# Patient Record
Sex: Female | Born: 1970 | Hispanic: Yes | Marital: Married | Smoking: Never smoker
Health system: Southern US, Community
[De-identification: ages and names within clinical notes are randomized; demographics above are authoritative.]

## PROBLEM LIST (undated history)

## (undated) DIAGNOSIS — G43909 Migraine, unspecified, not intractable, without status migrainosus: Secondary | ICD-10-CM

---

## 2005-06-27 ENCOUNTER — Emergency Department: Payer: Self-pay | Admitting: Emergency Medicine

## 2018-05-27 ENCOUNTER — Emergency Department
Admission: EM | Admit: 2018-05-27 | Discharge: 2018-05-27 | Disposition: A | Discharge status: Home / Self Care | Payer: BLUE CROSS/BLUE SHIELD | Attending: Student in an Organized Health Care Education/Training Program | Admitting: Student in an Organized Health Care Education/Training Program

## 2018-05-27 ENCOUNTER — Emergency Department: Payer: BLUE CROSS/BLUE SHIELD

## 2018-05-27 ENCOUNTER — Encounter: Payer: Self-pay | Admitting: Emergency Medicine

## 2018-05-27 ENCOUNTER — Other Ambulatory Visit: Payer: Self-pay

## 2018-05-27 DIAGNOSIS — Z711 Person with feared health complaint in whom no diagnosis is made: Secondary | ICD-10-CM | POA: Insufficient documentation

## 2018-05-27 DIAGNOSIS — R51 Headache: Secondary | ICD-10-CM | POA: Diagnosis not present

## 2018-05-27 DIAGNOSIS — K0889 Other specified disorders of teeth and supporting structures: Secondary | ICD-10-CM | POA: Insufficient documentation

## 2018-05-27 DIAGNOSIS — R519 Headache, unspecified: Secondary | ICD-10-CM

## 2018-05-27 HISTORY — DX: Migraine, unspecified, not intractable, without status migrainosus: G43.909

## 2018-05-27 MED ORDER — ACETAMINOPHEN 500 MG PO TABS: 500.0000 mg | ORAL_TABLET | Freq: Four times a day (QID) | ORAL | 0 refills | Status: AC | PRN

## 2018-05-27 MED ORDER — IBUPROFEN 600 MG PO TABS: 600.0000 mg | ORAL_TABLET | Freq: Four times a day (QID) | ORAL | 0 refills | Status: AC | PRN

## 2018-05-27 MED ORDER — KETOROLAC TROMETHAMINE 30 MG/ML IJ SOLN: 30.0000 mg | Freq: Once | INTRAMUSCULAR | Status: DC

## 2018-05-27 MED ORDER — KETOROLAC TROMETHAMINE 30 MG/ML IJ SOLN
30.0000 mg | Freq: Once | INTRAMUSCULAR | Status: AC
  Administered 2018-05-27: 23:00:00 30 mg via INTRAMUSCULAR
  Filled 2018-05-27: qty 1

## 2018-05-27 NOTE — Discharge Instructions (Signed)
OPTIONS FOR DENTAL FOLLOW UP CARE ° °[place] Department of Health and Human Services - Local Safety Net Dental Clinics °http://www.ncdhhs.gov/dph/oralhealth/services/safetynetclinics.htm °  °[facility] (336-562-3123) ° °[facility] [place] (919-933-9087) ° °[facility] ([phone] ext [phone]) ° °[facility] ([phone]) ° °[facility] ([phone]) °This clinic caters to the indigent population and is on a lottery system. °Location: °[facility], [address], [place] °Clinic Hours: °Wednesdays from 6pm - 9pm, patients seen by a lottery system. °For dates, call or go to [email or web address].[facility].edu/[facility]/patients/Dental-[facility] °Services: °Cleanings, fillings and simple extractions. °Payment Options: °DENTAL WORK IS FREE OF CHARGE. Bring proof of income or support. °Best way to get seen: °Arrive at 5:15 pm - this is a lottery, NOT first come/first serve, so arriving earlier will not increase your chances of being seen. °  °  °[facility] °[phone] °Select option 1 for emergencies °  °Location: °[facility], [address], [place] °Clinic Hours: °No walk-ins accepted - call the day before to schedule an appointment. °Check in times are 9:30 am and 1:30 pm. °Services: °Simple extractions, temporary fillings, pulpectomy/pulp debridement, uncomplicated abscess drainage. °Payment Options: °PAYMENT IS DUE AT THE TIME OF SERVICE.  Fee is usually $100-200, additional surgical procedures (e.g. abscess drainage) may be extra. °Cash, checks, Visa/MasterCard accepted.  Can file Medicaid if patient is covered for dental - patient should call case worker to check. °No discount for [facility] patients. °Best way to get seen: °MUST call the day before and get onto the schedule. Can usually be seen the next 1-2 days. No walk-ins accepted. °  °  °[facility] °[phone] °   °Location: °[facility], [address], [place] °Clinic Hours: °M, W, Th, F 8am or 1:30pm, Tues 9a or 1:30 - first come/first served. °Services: °Simple extractions, temporary fillings, uncomplicated abscess drainage.  You do not need to be an [place] resident. °Payment Options: °PAYMENT IS DUE AT THE TIME OF SERVICE. °Dental insurance, otherwise sliding scale - bring proof of income or support. °Depending on income and treatment needed, cost is usually $50-200. °Best way to get seen: °Arrive early as it is first come/first served. °  °  °[facility] °[phone] °  °Location: °[address] °Clinic Hours: °Mon-Thu 8a-5p °Services: °Most basic dental services including extractions and fillings. °Payment Options: °PAYMENT IS DUE AT THE TIME OF SERVICE. °Sliding scale, up to 50% off - bring proof if income or support. °Medicaid with dental option accepted. °Best way to get seen: °Call to schedule an appointment, can usually be seen within 2 weeks OR they will try to see walk-ins - show up at 8a or 2p (you may have to wait). °  °  °[facility] °[phone] °[place] RESIDENTS ONLY °  °Location: °[facility], [address], [place], [place] [address] °Clinic Hours: By appointment only. °Monday - Thursday 8am-5pm, Friday 8am-12pm °Services: Cleanings, fillings, extractions. °Payment Options: °PAYMENT IS DUE AT THE TIME OF SERVICE. °Cash, Visa or MasterCard. Sliding scale - $30 minimum per service. °Best way to get seen: °Come in to office, complete packet and make an appointment - need proof of income °or support monies for each household member and proof of [place] residence. °Usually takes about a month to get in. °  °  °[facility] °[phone] °  °Location: °[address]   [place] °Clinic Hours: Walk-in Urgent Care Dental Services are offered Monday-Friday  mornings only. °The numbers of emergencies accepted daily is limited to the number of °providers available. °Maximum 15 - Mondays, Wednesdays & Thursdays °Maximum 10 - Tuesdays & Fridays °Services: °You do not need to be a [place] resident to be seen for a dental emergency. °Emergencies are defined as pain, swelling, abnormal bleeding, or dental trauma. Walkins will receive x-rays if needed. °NOTE: Dental cleaning is not an emergency. °Payment Options: °PAYMENT IS DUE AT THE TIME OF SERVICE. °Minimum co-pay is $40.00 for uninsured patients. °Minimum co-pay is $3.00 for Medicaid with dental coverage. °Dental Insurance is accepted and must be presented at time of visit. °Medicare does not cover dental. °Forms of payment: Cash, credit card, checks. °Best way to get seen: °If not previously registered with the clinic, walk-in dental registration begins at 7:15 am and is on a first come/first serve basis. °If previously registered with the clinic, call to make an appointment. °  °  °[facility] °[phone] °[place] RESIDENTS ONLY °  °Location: °[address], [place], [place] °Clinic Hours: °Mon-Thu 10a-2p °Services: Extractions only! °Payment Options: °FREE (donations accepted) - bring proof of income or support °Best way to get seen: °Call and schedule an appointment OR come at 8am on the 1st Monday of every month (except for holidays) when it is first come/first served. °  °  °[facility] °[phone] °  °Location: °[address], [place] °Clinic Hours: °Friday mornings °Services, Payment Options, Best way to get seen: °Call for info °

## 2018-05-27 NOTE — ED Notes (Signed)
Pt in for possible overdose of OTC pain medications, See triage note.

## 2018-05-27 NOTE — ED Provider Notes (Signed)
[facility] Emergency Department Provider Note  ____________________________________________  Time seen: Approximately 9:17 PM  I have reviewed the triage vital signs and the nursing notes.   HISTORY  Chief Complaint Headache and Dental Pain    HPI Kirsten Burgess is a 48 y.o. female presents emergency department for evaluation of headache and questions about medication dosage.  Patient came to the emergency department today because she was concerned that she took too much Tylenol and Motrin yesterday.  She took 600 mg of Advil at 10 AM, 500 mg of Tylenol at 4 PM and 500 mg of Tylenol at 4:30 PM.  She did not take any more medications yesterday. She has never had a reaction with tylenol or motrin. She did not take any medication today.  Headache is primarily on the front and has been constant since yesterday.  She rates pain as a 5 out of 10.  She also had a tooth ache yesterday that has resolved today.  She has had a history of migraines in the past and has never seen anybody for them or been prescribed any medication.  No recent illness. She does not feel sick. She has not eaten much today because she has not been hungry.  No cough, shortness of breath, chest pain, nausea vomiting, abdominal pain, dysuria.  Past Medical History:  Diagnosis Date  . Migraine     There are no active problems to display for this patient.   History reviewed. No pertinent surgical history.  Prior to Admission medications   Medication Sig Start Date End Date Taking? Authorizing Provider  acetaminophen (TYLENOL) 500 MG tablet Take 1 tablet (500 mg total) by mouth every 6 (six) hours as needed. 05/27/18   Laban Emperor, PA-C  ibuprofen (ADVIL,MOTRIN) 600 MG tablet Take 1 tablet (600 mg total) by mouth every 6 (six) hours as needed. 05/27/18   Laban Emperor, PA-C    Allergies Patient has no known allergies.  History reviewed. No pertinent family history.  Social History Social  History   Tobacco Use  . Smoking status: Never Smoker  . Smokeless tobacco: Never Used  Substance Use Topics  . Alcohol use: Never    Frequency: Never  . Drug use: Never     Review of Systems  Constitutional: No fever/chills Cardiovascular: No chest pain. Respiratory: No SOB. Gastrointestinal: No abdominal pain.  No nausea, no vomiting.  Musculoskeletal: Negative for musculoskeletal pain. Skin: Negative for rash, abrasions, lacerations, ecchymosis. Neurological: Negative for numbness or tingling. Positive for headache.   ____________________________________________   PHYSICAL EXAM:  VITAL SIGNS: ED Triage Vitals  Enc Vitals Group     BP 05/27/18 2039 (!) 152/80     Pulse Rate 05/27/18 2039 (!) 122     Resp 05/27/18 2039 18     Temp 05/27/18 2039 98.2 F (36.8 C)     Temp Source 05/27/18 2039 Oral     SpO2 05/27/18 2039 97 %     Weight 05/27/18 2040 140 lb (63.5 kg)     Height 05/27/18 2040 4\' 11"  (1.499 m)     Head Circumference --      Peak Flow --      Pain Score 05/27/18 2050 5     Pain Loc --      Pain Edu? --      Excl. in [place]? --      Constitutional: Alert and oriented. Well appearing and in no acute distress. Eyes: Conjunctivae are normal. PERRL. EOMI. Head: Atraumatic. ENT:  Ears:      Nose: No congestion/rhinnorhea.      Mouth/Throat: Mucous membranes are moist.  Large cavity to left upper canine. No tenderness to palpation or visible swelling. Neck: No stridor.  Cardiovascular: Normal rate, regular rhythm.  Good peripheral circulation. Respiratory: Normal respiratory effort without tachypnea or retractions. Lungs CTAB. Good air entry to the bases with no decreased or absent breath sounds. Gastrointestinal: Bowel sounds 4 quadrants. Soft and nontender to palpation. No guarding or rigidity. No palpable masses. No distention.  Musculoskeletal: Full range of motion to all extremities. No gross deformities appreciated. Neurologic:  Normal speech  and language. No gross focal neurologic deficits are appreciated.  Skin:  Skin is warm, dry and intact. No rash noted. Psychiatric: Mood and affect are normal. Speech and behavior are normal. Patient exhibits appropriate insight and judgement.   ____________________________________________   LABS (all labs ordered are listed, but only abnormal results are displayed)  Labs Reviewed - No data to display ____________________________________________  EKG   ____________________________________________  RADIOLOGY Robinette Haines, personally viewed and evaluated these images (plain radiographs) as part of my medical decision making, as well as reviewing the written report by the radiologist.  Ct Head Wo Contrast  Result Date: 05/27/2018 CLINICAL DATA:  Headache. EXAM: CT HEAD WITHOUT CONTRAST TECHNIQUE: Contiguous axial images were obtained from the base of the skull through the vertex without intravenous contrast. COMPARISON:  None. FINDINGS: Brain: No evidence of acute infarction, hemorrhage, hydrocephalus, extra-axial collection or mass lesion/mass effect. Vascular: No hyperdense vessel or unexpected calcification. Skull: Normal. Negative for fracture or focal lesion. Sinuses/Orbits: No acute finding. Other: None. IMPRESSION: Normal head CT. Electronically Signed   By: Marijo Conception, M.D.   On: 05/27/2018 22:01    ____________________________________________    PROCEDURES  Procedure(s) performed:    Procedures    Medications  ketorolac (TORADOL) 30 MG/ML injection 30 mg (30 mg Intramuscular Given 05/27/18 2231)     ____________________________________________   INITIAL IMPRESSION / ASSESSMENT AND PLAN / ED COURSE  Pertinent labs & imaging results that were available during my care of the patient were reviewed by me and considered in my medical decision making (see chart for details).  Review of the [place] CSRS was performed in accordance of the [place] prior to dispensing  any controlled drugs.     Patient presented to emergency department with concerns of overdose on Tylenol and Motrin yesterday.  Patient took 600 mg of Motrin yesterday and 1000 mg of Tylenol yesterday.  She did not take any medications today.  She did not take any further medications yesterday.  Patient has had a headache since last yesterday.  Headache resolved with Toradol.  She is increasing her fluids and drinking apple juice.  She was also given graham crackers.  She denies any dental pain today.  Head CT was ordered initially because patient seemed vague about her reason for coming to the emergency department.  She then states that she was only worried about the amount of Tylenol and Motrin that she took yesterday.  Patient will be discharged home with prescriptions for Tylenol and Motrin so that she knows correct dosages. Patient is to follow up with primary care and dentist as directed.  Dental resources were given.  Patient is given ED precautions to return to the ED for any worsening or new symptoms.     ____________________________________________  FINAL CLINICAL IMPRESSION(S) / ED DIAGNOSES  Final diagnoses:  Acute nonintractable headache, unspecified headache type  Pain,  dental  Feared complaint without diagnosis      NEW MEDICATIONS STARTED DURING THIS VISIT:  ED Discharge Orders         Ordered    acetaminophen (TYLENOL) 500 MG tablet  Every 6 hours PRN     05/27/18 2253    ibuprofen (ADVIL,MOTRIN) 600 MG tablet  Every 6 hours PRN     05/27/18 2253              This chart was dictated using voice recognition software/Dragon. Despite best efforts to proofread, errors can occur which can change the meaning. Any change was purely unintentional.    Laban Emperor, PA-C 05/27/18 2320    Merlyn Lot, MD 05/27/18 [phone]

## 2018-05-27 NOTE — ED Triage Notes (Addendum)
Pt arrived for what she thought was an accidental overdose; pt says she took 600mg  Advil Saturday at 10am, and then today she took 500mg  Tylenol at 4pm and another at 430pm; no other medications were taken; meds were taken for a toothache, top left front,  which is now no longer hurting; pt also reports mild headache frontal and occipital; pt awake and alert; [facility] spanish interpreter Estill Bamberg with pt in triage

## 2021-03-16 ENCOUNTER — Other Ambulatory Visit: Payer: Self-pay | Admitting: Physician Assistant

## 2021-03-16 DIAGNOSIS — Z1231 Encounter for screening mammogram for malignant neoplasm of breast: Secondary | ICD-10-CM

## 2021-04-11 ENCOUNTER — Emergency Department: Payer: BLUE CROSS/BLUE SHIELD

## 2021-04-11 ENCOUNTER — Other Ambulatory Visit: Payer: Self-pay

## 2021-04-11 ENCOUNTER — Emergency Department
Admission: EM | Admit: 2021-04-11 | Discharge: 2021-04-11 | Disposition: A | Discharge status: Home / Self Care | Payer: BLUE CROSS/BLUE SHIELD | Attending: Emergency Medicine | Admitting: Emergency Medicine

## 2021-04-11 DIAGNOSIS — N939 Abnormal uterine and vaginal bleeding, unspecified: Secondary | ICD-10-CM | POA: Insufficient documentation

## 2021-04-11 DIAGNOSIS — D259 Leiomyoma of uterus, unspecified: Secondary | ICD-10-CM | POA: Diagnosis not present

## 2021-04-11 LAB — COMPREHENSIVE METABOLIC PANEL
ALT: 19 U/L (ref 0–44)
AST: 23 U/L (ref 15–41)
Albumin: 3.8 g/dL (ref 3.5–5.0)
Alkaline Phosphatase: 69 U/L (ref 38–126)
Anion gap: 12 (ref 5–15)
BUN: 15 mg/dL (ref 6–20)
CO2: 26 mmol/L (ref 22–32)
Calcium: 9 mg/dL (ref 8.9–10.3)
Chloride: 101 mmol/L (ref 98–111)
Creatinine, Ser: 1.02 mg/dL — ABNORMAL HIGH (ref 0.44–1.00)
GFR, Estimated: >60 mL/min (ref >60)
Glucose, Bld: 98 mg/dL (ref 70–99)
Potassium: 3.4 mmol/L — ABNORMAL LOW (ref 3.5–5.1)
Sodium: 139 mmol/L (ref 135–145)
Total Bilirubin: 0.1 mg/dL — ABNORMAL LOW (ref 0.3–1.2)
Total Protein: 7.4 g/dL (ref 6.5–8.1)

## 2021-04-11 LAB — CBC
HCT: 35.9 % — ABNORMAL LOW (ref 36.0–46.0)
Hemoglobin: 11.6 g/dL — ABNORMAL LOW (ref 12.0–15.0)
MCH: 28.4 pg (ref 26.0–34.0)
MCHC: 32.3 g/dL (ref 30.0–36.0)
MCV: 88 fL (ref 80.0–100.0)
Platelets: 303 10*3/uL (ref 150–400)
RBC: 4.08 MIL/uL (ref 3.87–5.11)
RDW: 13.2 % (ref 11.5–15.5)
WBC: 9.3 10*3/uL (ref 4.0–10.5)
nRBC: 0 % (ref 0.0–0.2)

## 2021-04-11 LAB — TYPE AND SCREEN
ABO/RH(D): O POS
Antibody Screen: NEGATIVE

## 2021-04-11 LAB — TSH: TSH: 1.601 u[IU]/mL (ref 0.350–4.500)

## 2021-04-11 NOTE — ED Triage Notes (Signed)
Pt has been having vaginal bleeding x3 weeks- pt states she is soaking 3-4 pads a day- pt states she does have a little cramping- pt denies any dizziness or lightheadedness

## 2021-04-11 NOTE — ED Provider Notes (Signed)
[facility] Provider Note    Event Date/Time   First MD Initiated Contact with Patient 04/11/21 1521     (approximate)   History   Vaginal Bleeding   HPI  Kirsten Burgess is a 51 y.o. female presents emergency department with vaginal bleeding for about 3 weeks.  She is soaking 3-4 pads a day.  Has a little cramping.  Denies being dizzy or lightheaded.  No abdominal pain other than the cramping.  Patient has started to skip periods.  This is the heaviest bleeding she has had with her menstrual cycle.  Denies history of uterine fibroids.      Physical Exam   Triage Vital Signs: ED Triage Vitals  Enc Vitals Group     BP 04/11/21 1419 (!) 151/69     Pulse Rate 04/11/21 1419 84     Resp 04/11/21 1419 18     Temp 04/11/21 1419 99.2 F (37.3 C)     Temp Source 04/11/21 1419 Oral     SpO2 04/11/21 1419 99 %     Weight 04/11/21 1416 145 lb (65.8 kg)     Height 04/11/21 1416 4\' 11"  (1.499 m)     Head Circumference --      Peak Flow --      Pain Score 04/11/21 1416 5     Pain Loc --      Pain Edu? --      Excl. in [place]? --     Most recent vital signs: Vitals:   04/11/21 1419  BP: (!) 151/69  Pulse: 84  Resp: 18  Temp: 99.2 F (37.3 C)  SpO2: 99%     General: Awake, no distress.   CV:  Good peripheral perfusion. regular rate and  rhythm Resp:  Normal effort. Lungs CTA Abd:  No distention.  Tender in lower quadrants bilaterally Other:      ED Results / Procedures / Treatments   Labs (all labs ordered are listed, but only abnormal results are displayed) Labs Reviewed  CBC - Abnormal; Notable for the following components:      Result Value   Hemoglobin 11.6 (*)    HCT 35.9 (*)    All other components within normal limits  COMPREHENSIVE METABOLIC PANEL - Abnormal; Notable for the following components:   Potassium 3.4 (*)    Creatinine, Ser 1.02 (*)    Total Bilirubin 0.1 (*)    All other components within normal limits  TSH   URINALYSIS, COMPLETE (UACMP) WITH MICROSCOPIC  POC URINE PREG, ED  TYPE AND SCREEN     EKG     RADIOLOGY Ultrasound of the pelvis with transvaginal for vaginal bleeding    PROCEDURES:   Procedures   MEDICATIONS ORDERED IN ED: Medications - No data to display   IMPRESSION / MDM / [place] / ED COURSE  I reviewed the triage vital signs and the nursing notes.                              Differential diagnosis includes, but is not limited to, hypothyroidism, abnormal uterine bleeding, fibroids, hemorrhage  Labs and imaging ordered.  Patient appears to be very stable at this time.  Very low suspicion for pregnancy but will do POC per protocols.  Labs are reassuring, with the patient CBC and metabolic panel are normal, indicating that she is not hemorrhaging, her ABO/Rh is a positive if she was  pregnant she would not need RhoGAM, TSH is normal she does not have hypothyroidism causing menorrhagia.  Ultrasound was independently reviewed by me.  Read by radiologist as having a fibroid.  I did explain this to the patient.  She is only using 3 pads per day so she can continue to follow-up as outpatient.  She is to call her GYN doctor follow-up with Dr. Marcelline Mates.  Explained her started to go through more than 1 pad per hour then she will need to return emergency department immediately.  Explained to her that fibroids can cause you to have heavy menstrual periods that do cause a lot of bleeding.  Patient is in agreement with treatment plan.  She is discharged stable condition.       FINAL CLINICAL IMPRESSION(S) / ED DIAGNOSES   Final diagnoses:  Vaginal bleeding  Uterine leiomyoma, unspecified location     Rx / DC Orders   ED Discharge Orders     None        Note:  This document was prepared using Dragon voice recognition software and may include unintentional dictation errors.    Versie Starks, PA-C 04/11/21 1756    Lucrezia Starch,  MD 04/11/21 [phone]

## 2021-04-19 ENCOUNTER — Telehealth: Payer: Self-pay | Admitting: Obstetrics and Gynecology

## 2021-04-19 NOTE — Telephone Encounter (Signed)
Tried calling patient thru interpreter line no answer phone just continues to ring not prompting for a voicemail message. Tried to call spouse number listed in patient contacts, Phone is no longer in service.  ?

## 2021-05-06 ENCOUNTER — Encounter: Payer: Self-pay | Admitting: Obstetrics and Gynecology

## 2021-05-25 ENCOUNTER — Ambulatory Visit
Admission: RE | Admit: 2021-05-25 | Discharge: 2021-05-25 | Disposition: A | Discharge status: Home / Self Care | Payer: BLUE CROSS/BLUE SHIELD | Source: Ambulatory Visit | Attending: Physician Assistant | Admitting: Physician Assistant

## 2021-05-25 DIAGNOSIS — Z1231 Encounter for screening mammogram for malignant neoplasm of breast: Secondary | ICD-10-CM | POA: Diagnosis present

## 2021-06-04 ENCOUNTER — Encounter: Payer: Self-pay | Admitting: Obstetrics and Gynecology

## 2021-07-06 ENCOUNTER — Ambulatory Visit: Payer: BLUE CROSS/BLUE SHIELD | Admitting: Obstetrics and Gynecology

## 2021-07-06 ENCOUNTER — Encounter: Payer: Self-pay | Admitting: Obstetrics and Gynecology

## 2021-07-06 VITALS — BP 147/83 | HR 77 | Ht 59.0 in | Wt 144.1 lb

## 2021-07-06 DIAGNOSIS — N939 Abnormal uterine and vaginal bleeding, unspecified: Secondary | ICD-10-CM

## 2021-07-06 DIAGNOSIS — Z7689 Persons encountering health services in other specified circumstances: Secondary | ICD-10-CM

## 2021-07-06 DIAGNOSIS — N951 Menopausal and female climacteric states: Secondary | ICD-10-CM | POA: Diagnosis not present

## 2021-07-06 DIAGNOSIS — D219 Benign neoplasm of connective and other soft tissue, unspecified: Secondary | ICD-10-CM

## 2021-07-06 NOTE — Progress Notes (Signed)
Patient presents today for vaginal bleeding concerns. She states she has been experiencing irregular bleeding for past several months. Patient states her menstrual would skip a month and then return, March she had a "hemorrhage" which led her to the ED, since then she has spotted monthly.  Patient states no other questions or concerns at this time.

## 2021-07-06 NOTE — Progress Notes (Signed)
HPI:      Ms. Kirsten Burgess is a 51 y.o. G2P0020 who LMP was Patient's last menstrual period was 06/30/2021 (exact date).  Subjective:   She presents today with complaint of irregular vaginal bleeding over the last 6 to 9 months.  She states that most of her life her periods were monthly and regular but she has occasionally skipped periods and occasionally had some very heavy irregular periods lately.  She also complains of new onset of hot flashes. Of significant note patient has a history of a 5 cm uterine fundal fibroid. She is currently not using anything for birth control and has not used anything for whole life.    Hx: The following portions of the patient's history were reviewed and updated as appropriate:             She  has a past medical history of Migraine. She does not have a problem list on file. She  has no past surgical history on file. Her family history includes Diabetes in her mother. She  reports that she has never smoked. She has never used smokeless tobacco. She reports that she does not drink alcohol and does not use drugs. She has a current medication list which includes the following prescription(s): acetaminophen, ibuprofen, and multivitamin. She has No Known Allergies.       Review of Systems:  Review of Systems  Constitutional: Denied constitutional symptoms, night sweats, recent illness, fatigue, fever, insomnia and weight loss.  Eyes: Denied eye symptoms, eye pain, photophobia, vision change and visual disturbance.  Ears/Nose/Throat/Neck: Denied ear, nose, throat or neck symptoms, hearing loss, nasal discharge, sinus congestion and sore throat.  Cardiovascular: Denied cardiovascular symptoms, arrhythmia, chest pain/pressure, edema, exercise intolerance, orthopnea and palpitations.  Respiratory: Denied pulmonary symptoms, asthma, pleuritic pain, productive sputum, cough, dyspnea and wheezing.  Gastrointestinal: Denied, gastro-esophageal reflux, melena, nausea  and vomiting.  Genitourinary: See HPI for additional information.  Musculoskeletal: Denied musculoskeletal symptoms, stiffness, swelling, muscle weakness and myalgia.  Dermatologic: Denied dermatology symptoms, rash and scar.  Neurologic: Denied neurology symptoms, dizziness, headache, neck pain and syncope.  Psychiatric: Denied psychiatric symptoms, anxiety and depression.  Endocrine: Denied endocrine symptoms including hot flashes and night sweats.   Meds:   Current Outpatient Medications on File Prior to Visit  Medication Sig Dispense Refill   acetaminophen (TYLENOL) 500 MG tablet Take 1 tablet (500 mg total) by mouth every 6 (six) hours as needed. 30 tablet 0   ibuprofen (ADVIL,MOTRIN) 600 MG tablet Take 1 tablet (600 mg total) by mouth every 6 (six) hours as needed. 30 tablet 0   Multiple Vitamin (MULTIVITAMIN) capsule Take 1 capsule by mouth daily.     No current facility-administered medications on file prior to visit.      Objective:     Vitals:   07/06/21 0929  BP: (!) 147/83  Pulse: 77   Filed Weights   07/06/21 0929  Weight: 144 lb 1.6 oz (65.4 kg)                        Assessment:    G2P0020 There are no problems to display for this patient.    1. Establishing care with new doctor, encounter for   2. Vaginal bleeding   3. Fibroid   4. Symptomatic menopausal or female climacteric states     Patient likely climacteric or menopausal.  This is very likely the source of her irregular bleeding possibly complicated by a uterine fibroid.  Plan:            1.  FSH -rule out menopause  2.  Patient states that she is not particularly interested in any medication to regulate her cycles or to fix her hot flashes at this time.  She is comfortable with expectant management to see if she becomes amenorrheic over the next 6 to 9 months. She will contact us if she desires management of her cycles or hot flashes.  If she does not become amenorrheic in 6 to 9 months  would recommend ultrasound for endometrial thickness or endometrial biopsy.  I have explained this to the patient.  Orders Orders Placed This Encounter  Procedures   Follicle stimulating hormone    No orders of the defined types were placed in this encounter.     F/U  Return in about 6 months (around 01/06/2022). I spent 31 minutes involved in the care of this patient preparing to see the patient by obtaining and reviewing her medical history (including labs, imaging tests and prior procedures), documenting clinical information in the electronic health record (EHR), counseling and coordinating care plans, writing and sending prescriptions, ordering tests or procedures and in direct communicating with the patient and medical staff discussing pertinent items from her history and physical exam.  Finis Bud, M.D. 07/06/2021 10:04 AM

## 2021-07-07 LAB — FOLLICLE STIMULATING HORMONE: FSH: 76 m[IU]/mL

## 2021-07-18 NOTE — Progress Notes (Signed)
Kirsten Burgess: She needs a video visit to discuss the results.

## 2021-07-21 ENCOUNTER — Telehealth: Payer: Self-pay | Admitting: Obstetrics and Gynecology

## 2021-07-21 NOTE — Telephone Encounter (Signed)
2nd attempt to cal pt to schedule follow up apt with dr.evans- unable to reach patient also attempted to contact through emergency contact as well

## 2021-07-22 ENCOUNTER — Encounter: Payer: Self-pay | Admitting: Obstetrics and Gynecology

## 2021-07-28 ENCOUNTER — Encounter: Payer: BLUE CROSS/BLUE SHIELD | Admitting: Obstetrics and Gynecology

## 2021-12-29 ENCOUNTER — Encounter: Payer: Self-pay | Admitting: Obstetrics and Gynecology

## 2021-12-29 ENCOUNTER — Ambulatory Visit: Payer: BLUE CROSS/BLUE SHIELD | Admitting: Obstetrics and Gynecology

## 2021-12-29 VITALS — BP 166/88 | HR 92 | Ht 59.0 in | Wt 144.7 lb

## 2021-12-29 DIAGNOSIS — N950 Postmenopausal bleeding: Secondary | ICD-10-CM | POA: Diagnosis not present

## 2021-12-29 DIAGNOSIS — D259 Leiomyoma of uterus, unspecified: Secondary | ICD-10-CM

## 2021-12-29 DIAGNOSIS — N951 Menopausal and female climacteric states: Secondary | ICD-10-CM

## 2021-12-29 DIAGNOSIS — D219 Benign neoplasm of connective and other soft tissue, unspecified: Secondary | ICD-10-CM

## 2021-12-29 MED ORDER — ESTRADIOL-NORETHINDRONE ACET 1-0.5 MG PO TABS: 1.0000 | ORAL_TABLET | Freq: Every day | ORAL | 1 refills | Status: AC

## 2021-12-29 NOTE — Progress Notes (Signed)
Patient presents today for a follow-up. She states in September she did have a few days of light bleeding, nothing since. History of a fibroid. Patient states experiencing hot flashes as well. No additional concerns.

## 2021-12-29 NOTE — Progress Notes (Signed)
HPI:      Kirsten Burgess is a 51 y.o. G2P0020 who LMP was Patient's last menstrual period was 06/30/2021 (exact date).  Subjective:   She presents today that she has had only 1 light episode of bleeding for a few days.  She has otherwise been amenorrheic.  She does report that she has noted a significant increase in hot flashes and menopausal symptoms. Her previous [place] was noted to be elevated consistent with menopause.    Hx: The following portions of the patient's history were reviewed and updated as appropriate:             She  has a past medical history of Migraine. She does not have a problem list on file. She  has no past surgical history on file. Her family history includes Diabetes in her mother. She  reports that she has never smoked. She has never used smokeless tobacco. She reports that she does not drink alcohol and does not use drugs. She has a current medication list which includes the following prescription(s): acetaminophen, ibuprofen, and multivitamin. She has No Known Allergies.       Review of Systems:  Review of Systems  Constitutional: Denied constitutional symptoms, night sweats, recent illness, fatigue, fever, insomnia and weight loss.  Eyes: Denied eye symptoms, eye pain, photophobia, vision change and visual disturbance.  Ears/Nose/Throat/Neck: Denied ear, nose, throat or neck symptoms, hearing loss, nasal discharge, sinus congestion and sore throat.  Cardiovascular: Denied cardiovascular symptoms, arrhythmia, chest pain/pressure, edema, exercise intolerance, orthopnea and palpitations.  Respiratory: Denied pulmonary symptoms, asthma, pleuritic pain, productive sputum, cough, dyspnea and wheezing.  Gastrointestinal: Denied, gastro-esophageal reflux, melena, nausea and vomiting.  Genitourinary: See HPI for additional information.  Musculoskeletal: Denied musculoskeletal symptoms, stiffness, swelling, muscle weakness and myalgia.  Dermatologic: Denied  dermatology symptoms, rash and scar.  Neurologic: Denied neurology symptoms, dizziness, headache, neck pain and syncope.  Psychiatric: Denied psychiatric symptoms, anxiety and depression.  Endocrine: See HPI for additional information.   Meds:   Current Outpatient Medications on File Prior to Visit  Medication Sig Dispense Refill   acetaminophen (TYLENOL) 500 MG tablet Take 1 tablet (500 mg total) by mouth every 6 (six) hours as needed. 30 tablet 0   ibuprofen (ADVIL,MOTRIN) 600 MG tablet Take 1 tablet (600 mg total) by mouth every 6 (six) hours as needed. 30 tablet 0   Multiple Vitamin (MULTIVITAMIN) capsule Take 1 capsule by mouth daily.     No current facility-administered medications on file prior to visit.      Objective:     Vitals:   12/29/21 0945  BP: (!) 166/88  Pulse: 92   Filed Weights   12/29/21 0945  Weight: 144 lb 11.2 oz (65.6 kg)                        Assessment:    G2P0020 There are no problems to display for this patient.    1. Symptomatic menopausal or female climacteric states   2. Fibroid   3. Postmenopausal bleeding     Single episode of short and light postmenopausal bleeding.  I do not believe a work-up is indicated at this time.   Worsening postmenopausal symptoms of hot flashes.   Plan:            1.  She has been instructed that if she has any further bleeding going forward she should notify us and I would likely recommend an endometrial biopsy.  2.  HRT I have discussed HRT with the patient in detail.  The risk/benefits of it were reviewed.  She understands that during menopause Estrogen decreases dramatically and that this results in an increased risk of cardiovascular disease as well as osteoporosis.  We have also discussed the fact that hot flashes often result from a decrease in Estrogen, and that by replacing Estrogen, they can often be alleviated.  We have discussed skin, vaginal and urinary tract changes that may also take  place from this drop in Estrogen.  Emotional changes have also been linked to Estrogen and we have briefly discussed this.  The benefits of HRT including decrease in hot flashes, vaginal dryness, and osteoporosis were discussed.  The emotional benefit and a possible change in her cardiovascular risk profile was also reviewed.  The risks associated with Hormone Replacement Therapy were also reviewed.  The use of unopposed Estrogen and its relationship to endometrial cancer was discussed.  The addition of Progesterone and its beneficial effect on endometrial cancer was also noted.  The fact that there has been no consistent definitive studies showing an increase in breast cancer in women who use HRT was discussed with the patient.  The possible side effects including breast tenderness, fluid retention, mood changes and vaginal bleeding were discussed.  The patient was informed that this is an elective medication and that she may choose not to take Hormone Replacement Therapy.  Literature on HRT was made available, and I believe that after answering all of the patient's questions.  Special emphasis on the WHI study, as well as several studies since that pertaining to the risks and benefits of estrogen replacement therapy were compared.  The possible limitations of these studies were discussed including the age stratification of the WHI study.  The possible increased role of Progesterone in these studies was discussed in detail.  Different types of hormone formulation and methods of taking hormone replacement therapy were discussed. Literature on HRT was made available, and I believe that after answering all of the patient's questions she has an adequate and informed understanding of the risks and benefits of HRT.  She has elected to begin Activella  Orders No orders of the defined types were placed in this encounter.   No orders of the defined types were placed in this encounter.     F/U  Return in about 3  months (around 03/31/2022). I spent 22 minutes involved in the care of this patient preparing to see the patient by obtaining and reviewing her medical history (including labs, imaging tests and prior procedures), documenting clinical information in the electronic health record (EHR), counseling and coordinating care plans, writing and sending prescriptions, ordering tests or procedures and in direct communicating with the patient and medical staff discussing pertinent items from her history and physical exam.  Finis Bud, M.D. 12/29/2021 10:54 AM

## 2022-04-05 ENCOUNTER — Ambulatory Visit: Payer: BLUE CROSS/BLUE SHIELD | Admitting: Obstetrics and Gynecology

## 2022-04-05 DIAGNOSIS — Z79890 Hormone replacement therapy (postmenopausal): Secondary | ICD-10-CM

## 2022-04-18 ENCOUNTER — Other Ambulatory Visit: Payer: Self-pay | Admitting: Family Medicine

## 2022-04-18 DIAGNOSIS — Z1231 Encounter for screening mammogram for malignant neoplasm of breast: Secondary | ICD-10-CM

## 2022-05-30 ENCOUNTER — Ambulatory Visit
Admission: RE | Admit: 2022-05-30 | Discharge: 2022-05-30 | Disposition: A | Discharge status: Home / Self Care | Payer: BLUE CROSS/BLUE SHIELD | Source: Ambulatory Visit | Attending: Family Medicine | Admitting: Family Medicine

## 2022-05-30 DIAGNOSIS — Z1231 Encounter for screening mammogram for malignant neoplasm of breast: Secondary | ICD-10-CM | POA: Insufficient documentation

## 2022-06-14 ENCOUNTER — Other Ambulatory Visit: Payer: Self-pay | Admitting: Family Medicine

## 2022-06-14 DIAGNOSIS — R928 Other abnormal and inconclusive findings on diagnostic imaging of breast: Secondary | ICD-10-CM

## 2022-06-14 DIAGNOSIS — R2231 Localized swelling, mass and lump, right upper limb: Secondary | ICD-10-CM

## 2022-06-27 ENCOUNTER — Ambulatory Visit
Admission: RE | Admit: 2022-06-27 | Discharge: 2022-06-27 | Disposition: A | Discharge status: Home / Self Care | Payer: BLUE CROSS/BLUE SHIELD | Source: Ambulatory Visit | Attending: Family Medicine | Admitting: Family Medicine

## 2022-06-27 DIAGNOSIS — R2231 Localized swelling, mass and lump, right upper limb: Secondary | ICD-10-CM | POA: Diagnosis present

## 2022-06-27 DIAGNOSIS — R928 Other abnormal and inconclusive findings on diagnostic imaging of breast: Secondary | ICD-10-CM | POA: Diagnosis present

## 2022-07-04 ENCOUNTER — Other Ambulatory Visit: Payer: Self-pay | Admitting: Family Medicine

## 2022-07-04 DIAGNOSIS — R928 Other abnormal and inconclusive findings on diagnostic imaging of breast: Secondary | ICD-10-CM

## 2022-07-04 DIAGNOSIS — R599 Enlarged lymph nodes, unspecified: Secondary | ICD-10-CM

## 2022-08-08 ENCOUNTER — Other Ambulatory Visit (HOSPITAL_COMMUNITY)
Admission: RE | Admit: 2022-08-08 | Discharge: 2022-08-08 | Disposition: A | Discharge status: Home / Self Care | Payer: BLUE CROSS/BLUE SHIELD | Source: Ambulatory Visit | Attending: Diagnostic Radiology | Admitting: Diagnostic Radiology

## 2022-08-08 ENCOUNTER — Ambulatory Visit
Admission: RE | Admit: 2022-08-08 | Discharge: 2022-08-08 | Disposition: A | Discharge status: Home / Self Care | Payer: BLUE CROSS/BLUE SHIELD | Source: Ambulatory Visit | Attending: Family Medicine | Admitting: Family Medicine

## 2022-08-08 DIAGNOSIS — R599 Enlarged lymph nodes, unspecified: Secondary | ICD-10-CM | POA: Diagnosis not present

## 2022-08-08 DIAGNOSIS — R928 Other abnormal and inconclusive findings on diagnostic imaging of breast: Secondary | ICD-10-CM | POA: Diagnosis present

## 2022-08-08 HISTORY — PX: BREAST BIOPSY: SHX20

## 2022-08-08 MED ORDER — LIDOCAINE-EPINEPHRINE 1 %-1:100000 IJ SOLN
5.0000 mL | Freq: Once | INTRAMUSCULAR | Status: AC
  Administered 2022-08-08: 5 mL via INTRADERMAL
  Filled 2022-08-08: qty 5

## 2022-08-08 MED ORDER — LIDOCAINE HCL (PF) 1 % IJ SOLN
3.0000 mL | Freq: Once | INTRAMUSCULAR | Status: AC
  Administered 2022-08-08: 3 mL via INTRADERMAL
  Filled 2022-08-08: qty 4

## 2022-08-10 LAB — SURGICAL PATHOLOGY

## 2023-10-13 IMAGING — MG MM DIGITAL SCREENING BILAT W/ TOMO AND CAD
8 series · 8 of 24 positions shown · non-contrast
Comparison: None.

CLINICAL DATA: Screening.

EXAM:
DIGITAL SCREENING BILATERAL MAMMOGRAM WITH TOMOSYNTHESIS AND CAD
TECHNIQUE: Bilateral screening digital craniocaudal and mediolateral oblique
mammograms were obtained. Bilateral screening digital breast
tomosynthesis was performed. The images were evaluated with
computer-aided detection.

[L CC synth-2D]
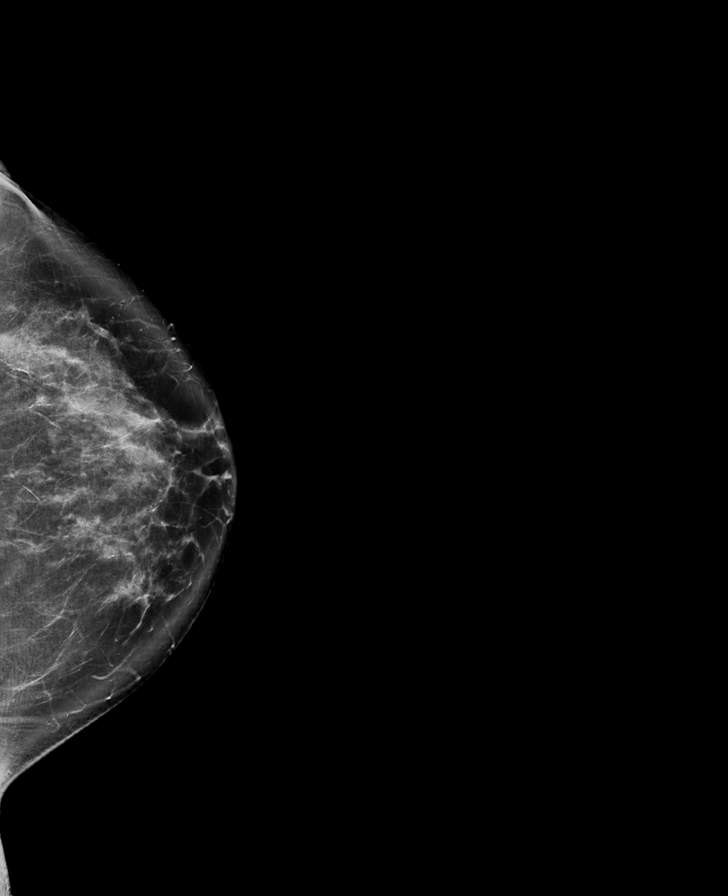

[L MLO synth-2D]
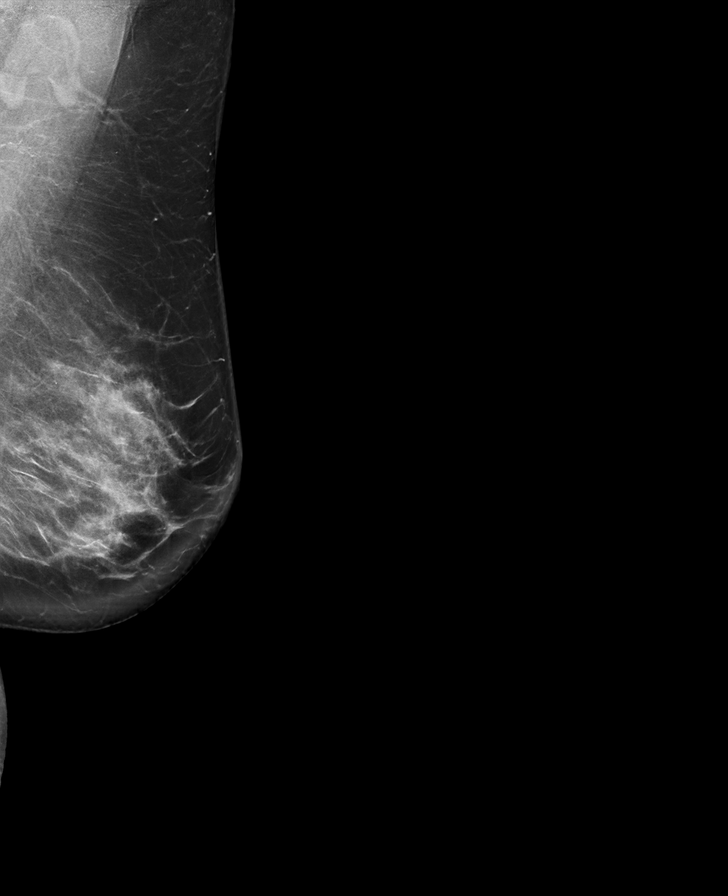

[R MLO synth-2D]
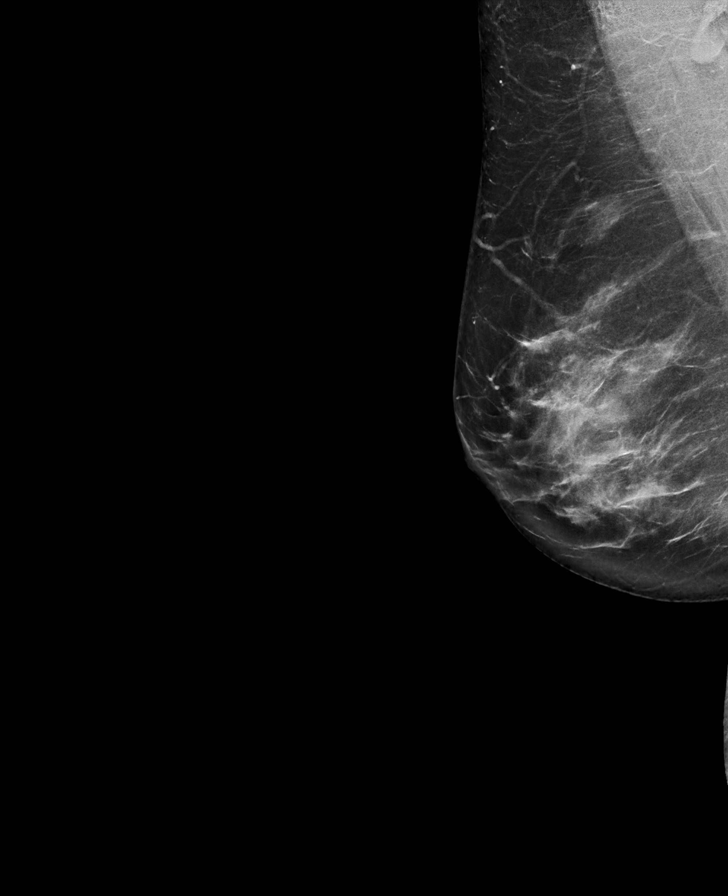

[R CC synth-2D]
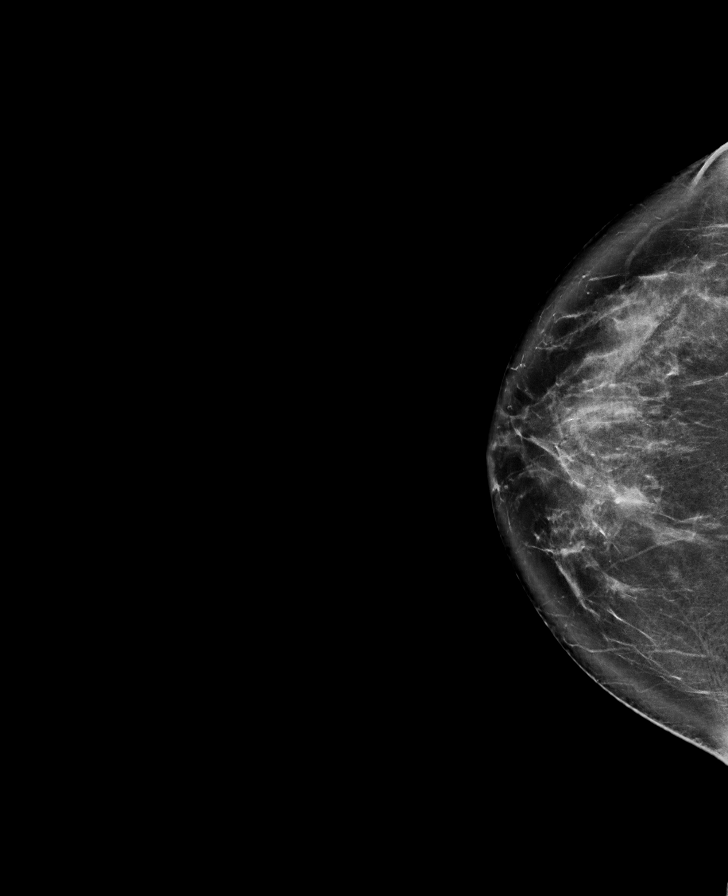

[L MLO tomo · tomo slice 49/97.0]
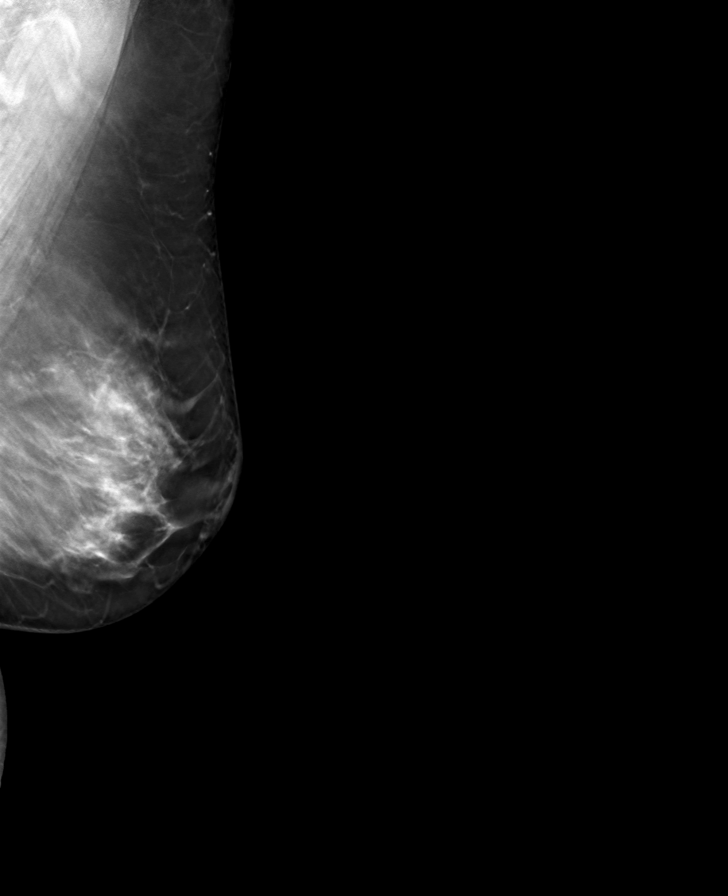

[R MLO tomo · tomo slice 45/88.0]
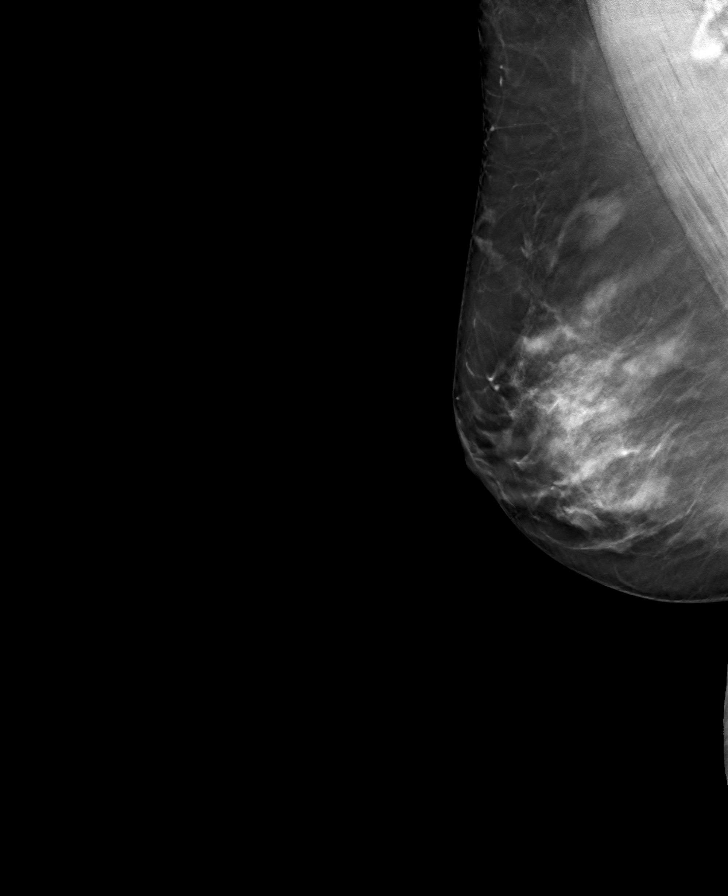

[R CC tomo · tomo slice 45/88.0]
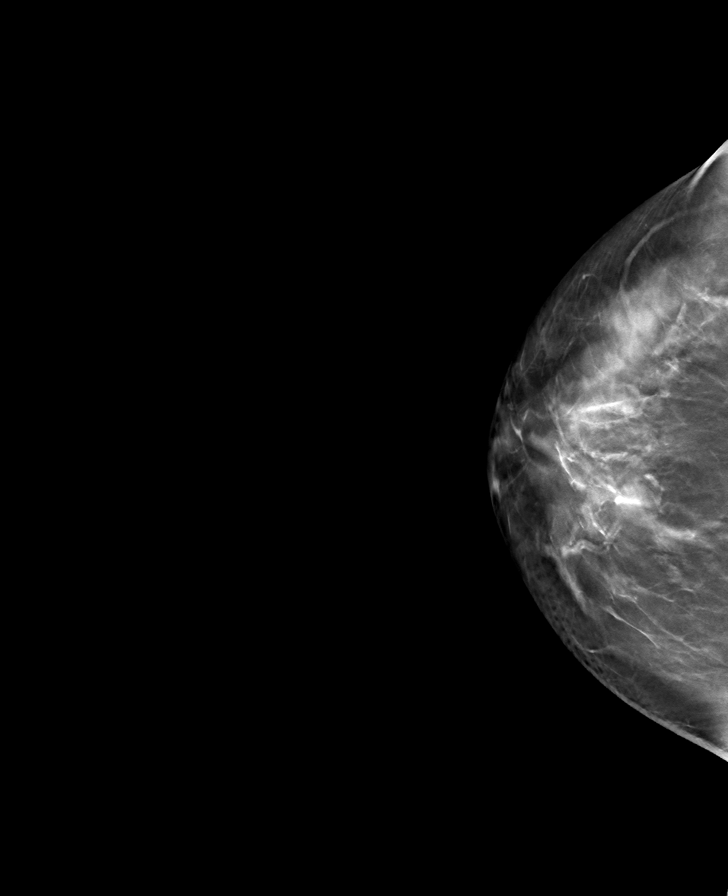

[L CC tomo · tomo slice 47/92.0]
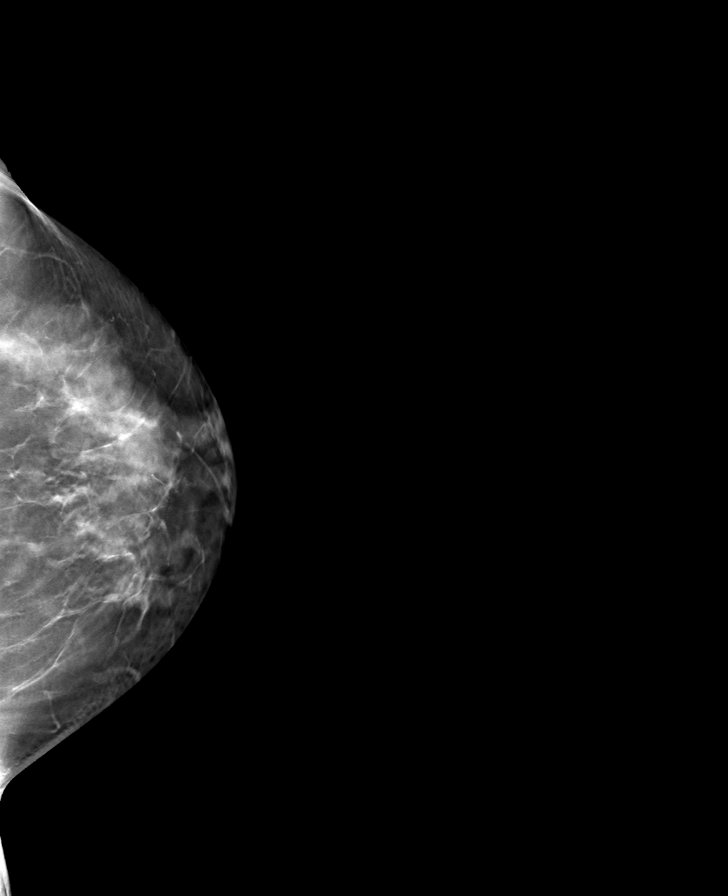

[8 of 24 positions shown; findings below may reference images not displayed]

ACR Breast Density Category c: The breast tissue is heterogeneously
dense, which may obscure small masses
FINDINGS: There are no findings suspicious for malignancy.
IMPRESSION: No mammographic evidence of malignancy. A result letter of this
screening mammogram will be mailed directly to the patient.

RECOMMENDATION:
Screening mammogram in one year. (Code:C8-T-HNK)

BI-RADS CATEGORY  1: Negative.
